# Patient Record
Sex: Male | Born: 1965 | Race: White | Hispanic: No | Marital: Married | Smoking: Never smoker
Health system: Southern US, Community
[De-identification: ages and names within clinical notes are randomized; demographics above are authoritative.]

---

## 2005-11-22 ENCOUNTER — Emergency Department (HOSPITAL_COMMUNITY): Admission: EM | Admit: 2005-11-22 | Discharge: 2005-11-22 | Payer: Self-pay | Admitting: Emergency Medicine

## 2009-05-08 ENCOUNTER — Ambulatory Visit (HOSPITAL_COMMUNITY): Admission: RE | Admit: 2009-05-08 | Discharge: 2009-05-08 | Payer: Self-pay | Admitting: Orthopedic Surgery

## 2009-10-12 ENCOUNTER — Emergency Department (HOSPITAL_COMMUNITY): Admission: EM | Admit: 2009-10-12 | Discharge: 2009-10-12 | Payer: Self-pay | Admitting: Emergency Medicine

## 2010-11-14 LAB — CBC
HCT: 39.3 % (ref 39.0–52.0)
MCV: 89.7 fL (ref 78.0–100.0)
RBC: 4.38 MIL/uL (ref 4.22–5.81)

## 2010-11-14 LAB — BASIC METABOLIC PANEL
BUN: 14 mg/dL (ref 6–23)
Calcium: 9.1 mg/dL (ref 8.4–10.5)
Creatinine, Ser: 0.95 mg/dL (ref 0.4–1.5)
Glucose, Bld: 96 mg/dL (ref 70–99)

## 2010-11-14 LAB — PROTIME-INR: Prothrombin Time: 12.7 seconds (ref 11.6–15.2)

## 2015-01-03 ENCOUNTER — Ambulatory Visit (INDEPENDENT_AMBULATORY_CARE_PROVIDER_SITE_OTHER): Payer: 59 | Admitting: Podiatrist

## 2015-01-03 ENCOUNTER — Encounter: Payer: Self-pay | Admitting: Podiatrist

## 2015-01-03 VITALS — BP 115/74 | HR 70 | Resp 18

## 2015-01-03 DIAGNOSIS — M722 Plantar fascial fibromatosis: Secondary | ICD-10-CM | POA: Diagnosis not present

## 2015-01-03 NOTE — Progress Notes (Signed)
   Subjective:    Patient ID: Tony Walter, male    DOB: [birth year], 49 y.o.   MRN: [id number]  HPI I NEED SOME NEW INSERTS     Review of Systems  All other systems reviewed and are negative.      Objective:   Physical Exam  Patient is awake, alert, and oriented x 3.  In no acute distress.  Vascular status is intact with palpable pedal pulses at 2/4 DP and PT bilateral and capillary refill time within normal limits. Neurological sensation is also intact bilaterally via Semmes Weinstein monofilament at 5/5 sites. Light touch, vibratory sensation, Achilles tendon reflex is intact. Dermatological exam reveals skin color, turger and texture as normal. No open lesions present.  Musculature intact with dorsiflexion, plantarflexion, inversion, eversion.   Plantar fasciitis symptomatology is noted bilateral.  Pain with palpation of the medial calcaneal tubercle is present bilateral.  Pain is improved with use of custom orthotics       Assessment & Plan:  Plantar fasciitis bilateral  Plan:  Patient was scanned for orthotics-- I am trying to make them as close to the last pair as I can as he did like those a lot--he did not bring in his orthotics today and I asked he bring them in next time in case we need to make adjustments.

## 2015-02-06 ENCOUNTER — Ambulatory Visit (INDEPENDENT_AMBULATORY_CARE_PROVIDER_SITE_OTHER): Payer: Commercial Managed Care - HMO | Admitting: Podiatry

## 2015-02-06 DIAGNOSIS — M722 Plantar fascial fibromatosis: Secondary | ICD-10-CM

## 2015-02-07 NOTE — Progress Notes (Signed)
Subjective:     Patient ID: Tony Walter, male   DOB: Dec 10, 1965, 49 y.o.   MRN: [id number]  HPIPatient present to pick up his new orthoses   Review of Systems     Objective:   Physical Exam Objective: Review of past medical history, medications, social history and allergies were performed.  Vascular: Dorsalis pedis and posterior tibial pulses were palpable B/L, capillary refill was  WNL B/L, temperature gradient was WNL B/L   Skin:  No signs of symptoms of infection or ulcers on both feet  Nails: appear healthy with no signs of mycosis or infections  Sensory: Semmes Weinstein monifilament WNL   Orthopedic: Orthopedic evaluation demonstrates all joints distal t ankle have full ROM without crepitus, muscle power WNL B/L     Assessment:     Plantar fascitis     Plan:     Dsp orthoses.  RTC prn

## 2018-07-09 DIAGNOSIS — J069 Acute upper respiratory infection, unspecified: Secondary | ICD-10-CM | POA: Diagnosis not present

## 2018-07-10 DIAGNOSIS — R05 Cough: Secondary | ICD-10-CM | POA: Diagnosis not present

## 2018-07-10 DIAGNOSIS — R062 Wheezing: Secondary | ICD-10-CM | POA: Diagnosis not present

## 2018-07-10 DIAGNOSIS — J209 Acute bronchitis, unspecified: Secondary | ICD-10-CM | POA: Diagnosis not present

## 2020-12-11 ENCOUNTER — Other Ambulatory Visit: Payer: Self-pay | Admitting: Orthopedic Surgery

## 2020-12-11 DIAGNOSIS — M545 Low back pain, unspecified: Secondary | ICD-10-CM

## 2020-12-30 ENCOUNTER — Ambulatory Visit
Admission: RE | Admit: 2020-12-30 | Discharge: 2020-12-30 | Disposition: A | Discharge status: Home / Self Care | Payer: 59 | Source: Ambulatory Visit | Attending: Orthopedic Surgery | Admitting: Orthopedic Surgery

## 2020-12-30 DIAGNOSIS — M545 Low back pain, unspecified: Secondary | ICD-10-CM

## 2021-11-11 IMAGING — MR MR LUMBAR SPINE W/O CM
4 of 5 series · 18 of 48 positions shown · non-contrast
Comparison: None.

CLINICAL DATA: Low back pain.  Left upper leg numbness for 1 month.

EXAM:
MRI LUMBAR SPINE WITHOUT CONTRAST
TECHNIQUE: Multiplanar, multisequence MR imaging of the lumbar spine was
performed. No intravenous contrast was administered.

[Series 6: T2 · sagittal · 4.0mm · 0.73mm/px · 6 of 13 slices shown (1 of 2)]
[im 1/13]
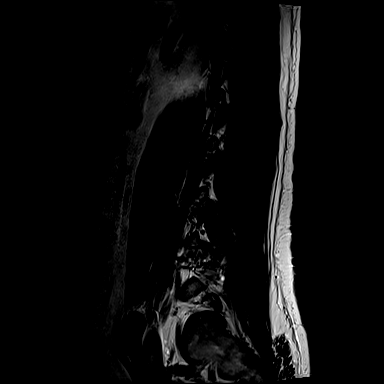
[im 3/13]
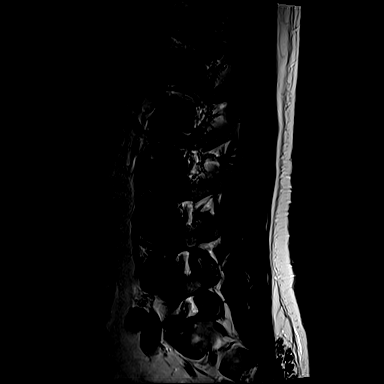
[im 5/13]
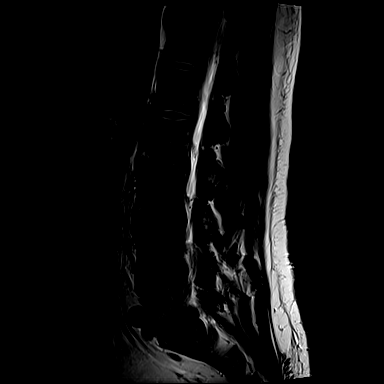
[im 8/13]
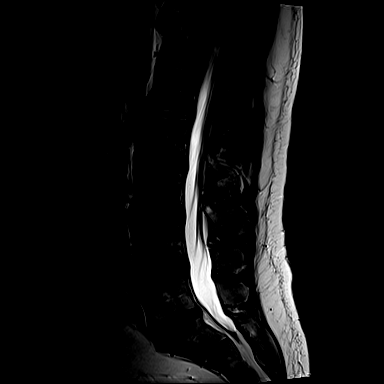
[im 10/13]
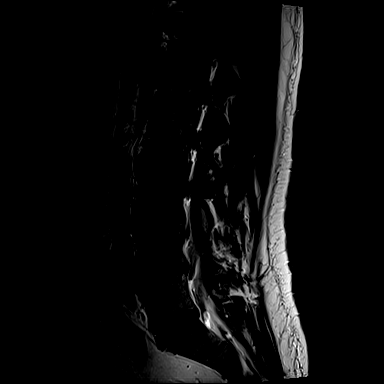
[im 13/13]
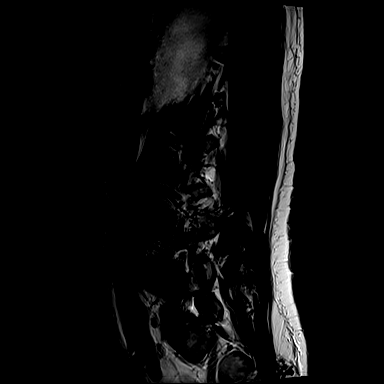

[Series 7: T1 · sagittal · 4.0mm · 0.73mm/px · 3 of 13 slices shown (1 of 2)]
[im 1/13]
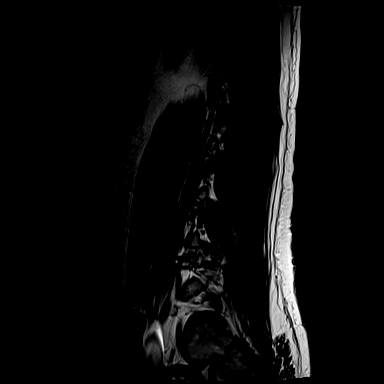
[im 7/13]
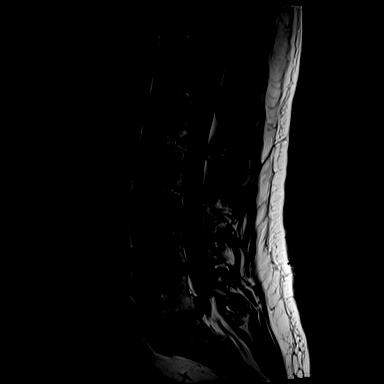
[im 13/13]
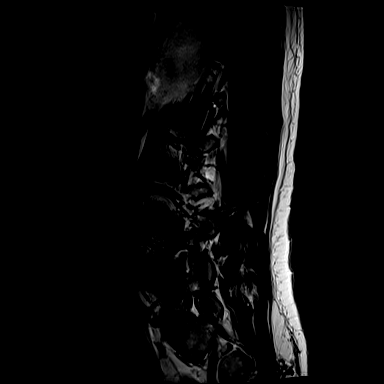

[Series 11: T2 · axial · 4.0mm · 0.28mm/px · z∈[-178,+7]mm · 6 of 39 slices shown (2 of 2)]
[im 3/39]
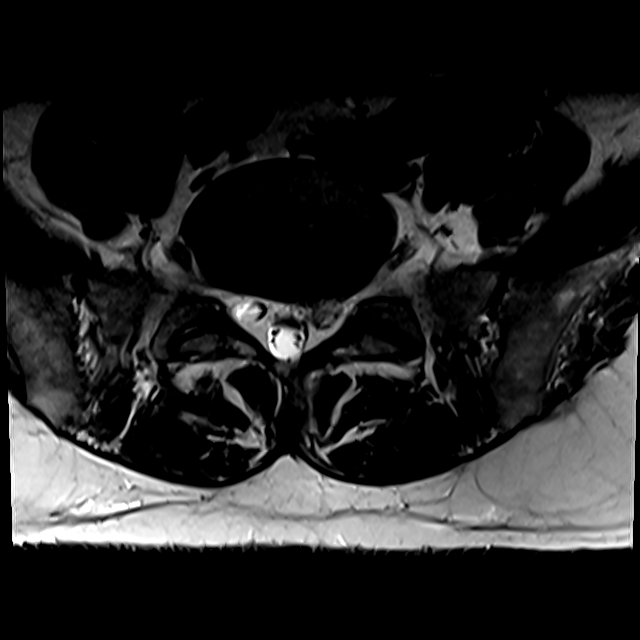
[im 6/39]
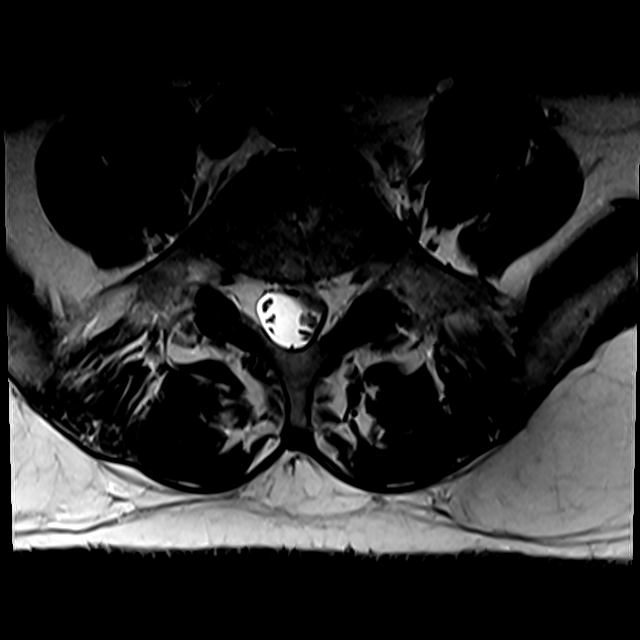
[im 8/39]
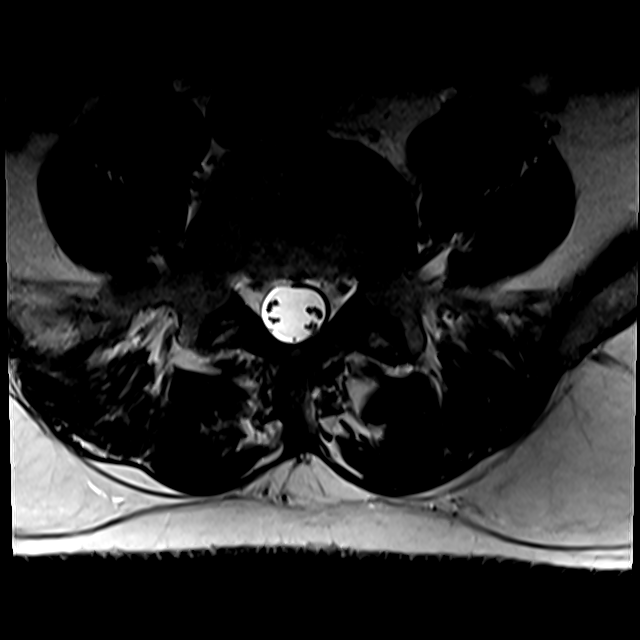
[im 13/39]
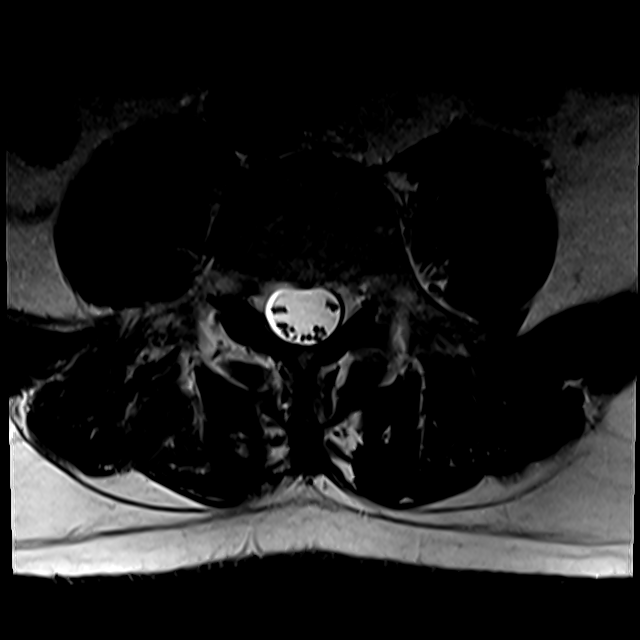
[im 21/39]
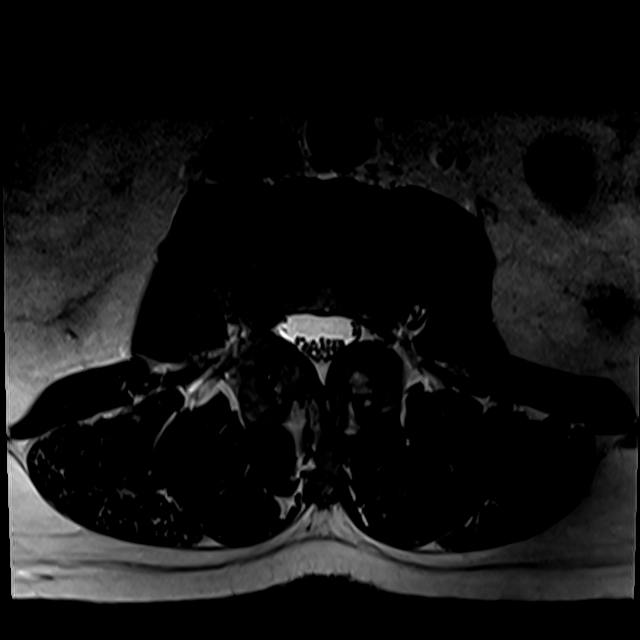
[im 33/39]
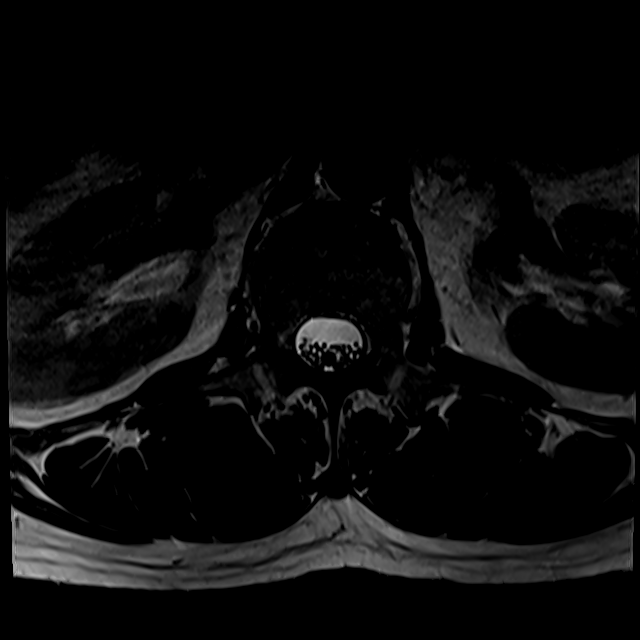

[Series 100: T1 · axial · 4.0mm · 0.28mm/px · z∈[-163,+7]mm · 3 of 39 slices shown (2 of 2)]
[im 6/39]
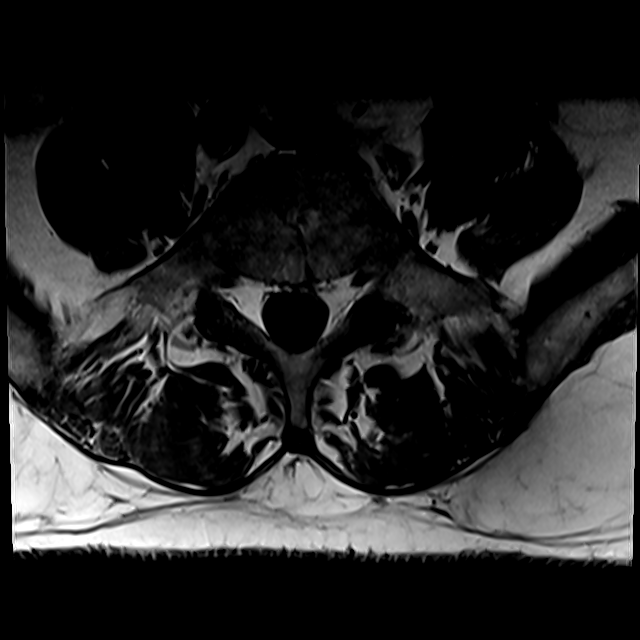
[im 21/39]
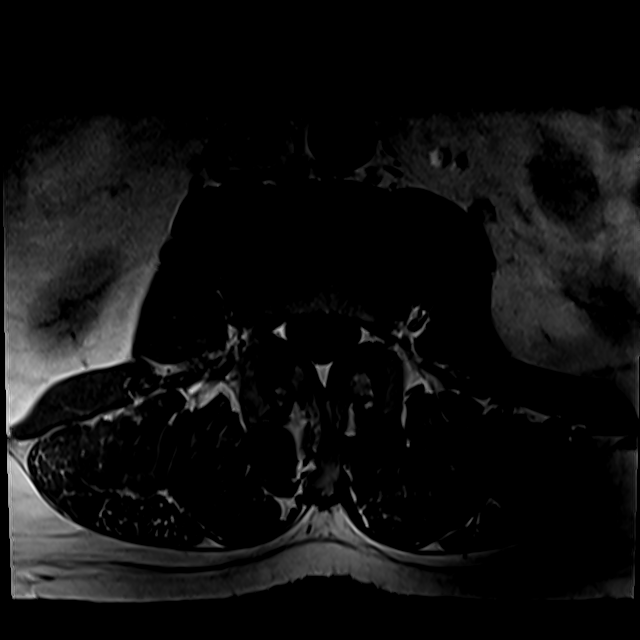
[im 33/39]
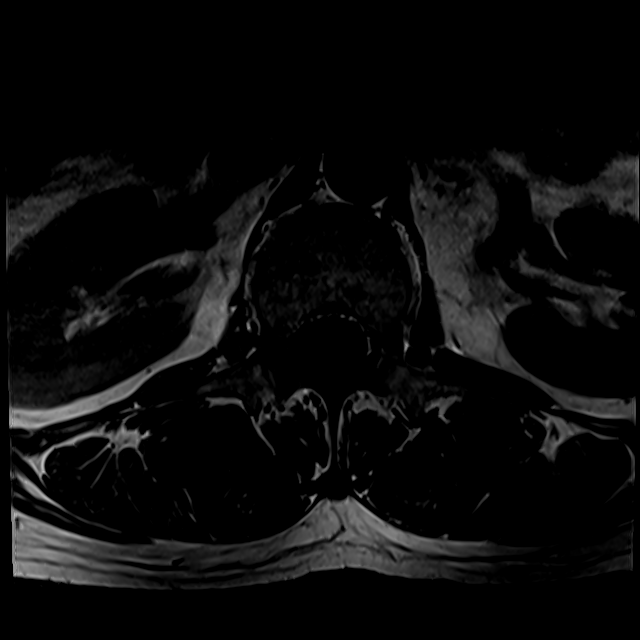

[18 of 48 positions shown; findings below may reference images not displayed]

FINDINGS: Segmentation: Standard.

Alignment:  Normal.

Vertebrae: No fracture, suspicious osseous lesion, or significant
marrow edema.

Conus medullaris and cauda equina: Conus extends to the L1-2 level.
Conus and cauda equina appear normal.

Paraspinal and other soft tissues: Unremarkable.

Disc levels:

T12-L1 and L1-2: Negative.

L2-3: Normal disc. Mild facet arthrosis without stenosis.

L3-4: Normal disc.  Mild facet arthrosis without stenosis.

L4-5: Slight disc desiccation. Mild disc bulging, a small right
foraminal disc osteophyte complex with annular fissure, and mild
facet arthrosis result in mild right neural foraminal stenosis
without spinal stenosis.

L5-S1: Mild disc desiccation. There is a moderately large left
paracentral and subarticular disc extrusion with slight superior
migration resulting in severe left lateral recess stenosis and left
S1 nerve root impingement. No generalized spinal stenosis or neural
foraminal stenosis.
IMPRESSION: 1. L5-S1 disc extrusion with left S1 nerve root impingement in the
lateral recess.
2. Mild right neural foraminal stenosis at L4-5.

## 2022-11-17 ENCOUNTER — Ambulatory Visit: Payer: 59 | Admitting: Podiatry

## 2022-11-17 DIAGNOSIS — M2141 Flat foot [pes planus] (acquired), right foot: Secondary | ICD-10-CM

## 2022-11-17 DIAGNOSIS — M2142 Flat foot [pes planus] (acquired), left foot: Secondary | ICD-10-CM | POA: Diagnosis not present

## 2022-11-17 NOTE — Progress Notes (Signed)
  Subjective:  Patient ID: Tony Walter, male    DOB: 08/27/1965,  MRN: [id number]  Chief Complaint  Patient presents with   FOOT EVALUATION    EVALUATION FOR ORTHOTICS    57 y.o. male presents for evaluation for new orthotics for bilateral foot.  He states that he got a pair of custom-made orthotics by the elder Dr. Ralene Cork approximately 8 years ago.  They are beginning to begin wearing down.  He says he is hoping to get a new pair of orthotics.  He says his primary issue is fallen arches bilaterally.  Does not have pain at this time however.  No past medical history on file.  No Known Allergies  ROS: Negative except as per HPI above  Objective:  General: AAO x3, NAD  Dermatological: With inspection and palpation of the right and left lower extremities there are no open sores, no preulcerative lesions, no rash or signs of infection present. Nails are of normal length thickness and coloration.   Vascular:  Dorsalis Pedis artery and Posterior Tibial artery pedal pulses are 2/4 bilateral.  Capillary fill time < 3 sec to all digits.   Neruologic: Grossly intact via light touch bilateral. Protective threshold intact to all sites bilateral.   Musculoskeletal: Pes planus foot deformity noted bilaterally and patient is then resting calcaneal stance position.  Loss of the longitudinal medial arch.  No evidence of posterior tibial tendon dysfunction or pain at this time  Gait: Unassisted, Nonantalgic.   No images are attached to the encounter.  Radiographs:  Deferred at this visit Assessment:   1. Pes planus of both feet      Plan:  Patient was evaluated and treated and all questions answered.  # Pes planus bilaterally -Discussed with patient that I do believe he is a good candidate for custom-made orthotic usage for his flatfoot deformity -As his old pair is now 56 years old he is due for a new pair of custom-made orthotics and would like to proceed with getting these casted  today -Patient was scanned for custom-made orthotics for pes planus deformity with proper medial arch support and deep heel cup -Discussed proper shoes and footwear for the patient to be using these orthotics with.  Return for Orthotics dispense.          Corinna Gab, DPM [facility] / [facility]

## 2022-11-18 ENCOUNTER — Telehealth: Payer: Self-pay | Admitting: Podiatry

## 2022-11-18 NOTE — Telephone Encounter (Signed)
Patient called, he spoke with the insurance company and they said they will cover orthotics every 3 years.  Please go ahead and put in order.

## 2022-12-02 ENCOUNTER — Telehealth: Payer: Self-pay | Admitting: Podiatry

## 2022-12-02 NOTE — Telephone Encounter (Signed)
Lmom to call back to schedule appt in [place] to pick up orthotics    Charges need posted

## 2022-12-23 ENCOUNTER — Ambulatory Visit (INDEPENDENT_AMBULATORY_CARE_PROVIDER_SITE_OTHER): Payer: 59

## 2022-12-23 DIAGNOSIS — M2141 Flat foot [pes planus] (acquired), right foot: Secondary | ICD-10-CM

## 2022-12-23 DIAGNOSIS — M2142 Flat foot [pes planus] (acquired), left foot: Secondary | ICD-10-CM

## 2022-12-23 NOTE — Progress Notes (Signed)
Patient presents today to pick up custom molded foot orthotics recommended by Dr. Annamary Rummage.   Orthotics were dispensed and fit was satisfactory. Reviewed instructions for break-in and wear. Written instructions given to patient.  Patient will follow up as needed.
